# Patient Record
Sex: Male | Born: 1970 | Race: Black or African American | Hispanic: No | Marital: Married | Smoking: Current every day smoker
Health system: Southern US, Community
[De-identification: ages and names within clinical notes are randomized; demographics above are authoritative.]

## PROBLEM LIST (undated history)

## (undated) HISTORY — PX: APPENDECTOMY: SHX54

---

## 2019-12-15 ENCOUNTER — Encounter (HOSPITAL_BASED_OUTPATIENT_CLINIC_OR_DEPARTMENT_OTHER): Payer: Self-pay | Admitting: Emergency Medicine

## 2019-12-15 ENCOUNTER — Emergency Department (HOSPITAL_BASED_OUTPATIENT_CLINIC_OR_DEPARTMENT_OTHER): Payer: Medicaid Other

## 2019-12-15 ENCOUNTER — Emergency Department (HOSPITAL_BASED_OUTPATIENT_CLINIC_OR_DEPARTMENT_OTHER)
Admission: EM | Admit: 2019-12-15 | Discharge: 2019-12-15 | Disposition: A | Discharge status: Home / Self Care | Payer: Medicaid Other | Attending: Emergency Medicine | Admitting: Emergency Medicine

## 2019-12-15 ENCOUNTER — Other Ambulatory Visit: Payer: Self-pay

## 2019-12-15 DIAGNOSIS — R252 Cramp and spasm: Secondary | ICD-10-CM

## 2019-12-15 DIAGNOSIS — M62838 Other muscle spasm: Secondary | ICD-10-CM | POA: Insufficient documentation

## 2019-12-15 DIAGNOSIS — F17210 Nicotine dependence, cigarettes, uncomplicated: Secondary | ICD-10-CM | POA: Insufficient documentation

## 2019-12-15 DIAGNOSIS — M25511 Pain in right shoulder: Secondary | ICD-10-CM | POA: Diagnosis present

## 2019-12-15 MED ORDER — METHOCARBAMOL 500 MG PO TABS
500.0000 mg | ORAL_TABLET | Freq: Once | ORAL | Status: AC
  Administered 2019-12-15: 500 mg via ORAL
  Filled 2019-12-15: qty 1

## 2019-12-15 MED ORDER — LIDOCAINE 5 % EX PTCH
1.0000 | MEDICATED_PATCH | CUTANEOUS | Status: DC
  Administered 2019-12-15: 1 via TRANSDERMAL
  Filled 2019-12-15: qty 1

## 2019-12-15 MED ORDER — NAPROXEN 250 MG PO TABS
500.0000 mg | ORAL_TABLET | Freq: Once | ORAL | Status: AC
  Administered 2019-12-15: 500 mg via ORAL
  Filled 2019-12-15: qty 2

## 2019-12-15 MED ORDER — METHOCARBAMOL 500 MG PO TABS: 500.0000 mg | ORAL_TABLET | Freq: Two times a day (BID) | ORAL | 0 refills | Status: AC

## 2019-12-15 NOTE — ED Triage Notes (Signed)
Pt arrives pov with driver with c/o right shoulder pain x 1 week, denies injury. Pt reports feeling that he may have slept on it wrong.  Pt also verbalizes concern of reaction to icy hot. Small circular bumps noted. Pt denies itching

## 2019-12-15 NOTE — Discharge Instructions (Signed)
As we discussed, take the medications that are prescribed.  More importantly, stretch the back of your shoulder and your neck area by performing stretching exercises that we discussed.  You should be performing those exercises multiple times a day. Deep tissue massage, warm compresses should also be utilized.  Follow-up with the doctor as requested if your symptoms are not getting better, as you might need physical therapy in that case

## 2019-12-16 NOTE — ED Provider Notes (Signed)
[facility] EMERGENCY DEPARTMENT Provider Note   CSN: [id number] Arrival date & time: 12/15/19  1801     History Chief Complaint  Patient presents with  . Shoulder Pain    Keith Dyer is a 49 y.o. male.  HPI     49 year old male comes in a chief complaint of right-sided shoulder pain.  Patient reports that he started having shoulder pain about a week ago.  There is no injury.  He woke up on the pain in the shoulder.  There is no associated numbness chills.  Patient has no known injuries the right shoulder but used to lift heavy boxes few months back.  No past medical history on file.  There are no problems to display for this patient.      No family history on file.  Social History   Tobacco Use  . Smoking status: Current Every Day Smoker    Packs/day: 0.50    Types: Cigarettes  Substance Use Topics  . Alcohol use: Yes  . Drug use: Yes    Types: Marijuana    Home Medications Prior to Admission medications   Medication Sig Start Date End Date Taking? Authorizing Provider  methocarbamol (ROBAXIN) 500 MG tablet Take 1 tablet (500 mg total) by mouth 2 (two) times daily. 12/15/19   Derwood Kaplan, MD    Allergies    Patient has no allergy information on record.  Review of Systems   Review of Systems  Constitutional: Positive for activity change. Negative for fever.  Respiratory: Negative for shortness of breath.   Cardiovascular: Negative for chest pain.  Musculoskeletal: Positive for arthralgias.  Allergic/Immunologic: Negative for immunocompromised state.    Physical Exam Updated Vital Signs BP 127/89 (BP Location: Right Arm)   Pulse 72   Temp 99 F (37.2 C) (Oral)   Resp 19   Ht 5\' 9"  (1.753 m)   Wt 87.1 kg   SpO2 100%   BMI 28.35 kg/m   Physical Exam Vitals and nursing note reviewed.  Constitutional:      Appearance: He is well-developed.  HENT:     Head: Atraumatic.  Cardiovascular:     Rate and Rhythm: Normal rate.   Pulmonary:     Effort: Pulmonary effort is normal.  Musculoskeletal:     Cervical back: Neck supple.     Comments: Patient has reproducible tenderness with palpation of the scapular region, paraspinal region over the cervical spine.  He has large palpable spasms, and his symptoms are reproduced with manipulation of them.  No C-spine midline tenderness.    Skin:    General: Skin is warm.  Neurological:     Mental Status: He is alert and oriented to person, place, and time.     ED Results / Procedures / Treatments   Labs (all labs ordered are listed, but only abnormal results are displayed) Labs Reviewed - No data to display  EKG None  Radiology DG Shoulder Right  Result Date: 12/15/2019 CLINICAL DATA:  Right shoulder pain EXAM: RIGHT SHOULDER - 2+ VIEW COMPARISON:  None. FINDINGS: There is no acute displaced fracture. No dislocation. Incidentally noted is an os acromiale, a normal variant. There are minimal degenerative changes of the glenohumeral joint. IMPRESSION: No acute displaced fracture or dislocation. Minimal degenerative changes of the glenohumeral joint. Electronically Signed   By: 14/05/2019 M.D.   On: 12/15/2019 19:09    Procedures Procedures (including critical care time)  Medications Ordered in ED Medications  naproxen (NAPROSYN) tablet 500  mg (500 mg Oral Given 12/15/19 2245)  methocarbamol (ROBAXIN) tablet 500 mg (500 mg Oral Given 12/15/19 2245)    ED Course  I have reviewed the triage vital signs and the nursing notes.  Pertinent labs & imaging results that were available during my care of the patient were reviewed by me and considered in my medical decision making (see chart for details).    MDM Rules/Calculators/A&P                          Patient comes in a chief complaint of right-sided shoulder pain into 1 week.  He is immunocompetent.  On exam he is able to abduct and forward flex his shoulder without significant discomfort.  He is having  significant discomfort with manipulation of the large spasms he has.  I suspect that he has progressively gotten worse with his spasms, and that is impacting his pain.  We went over multiple stretching exercises and other conservative measures to relieve the spasms.  Follow-up to sports medicine provided if patient does not get better -as he will likely need granule injections or sickle therapy.  Final Clinical Impression(s) / ED Diagnoses Final diagnoses:  Spasm    Rx / DC Orders ED Discharge Orders         Ordered    methocarbamol (ROBAXIN) 500 MG tablet  2 times daily        12/15/19 2237           Derwood Kaplan, MD 12/16/19 1713

## 2022-08-13 IMAGING — CR DG SHOULDER 2+V*R*
3 series · 3 of 3 positions shown · non-contrast
Comparison: None.

CLINICAL DATA: Right shoulder pain

EXAM:
RIGHT SHOULDER - 2+ VIEW

[w shoulder grashey right]
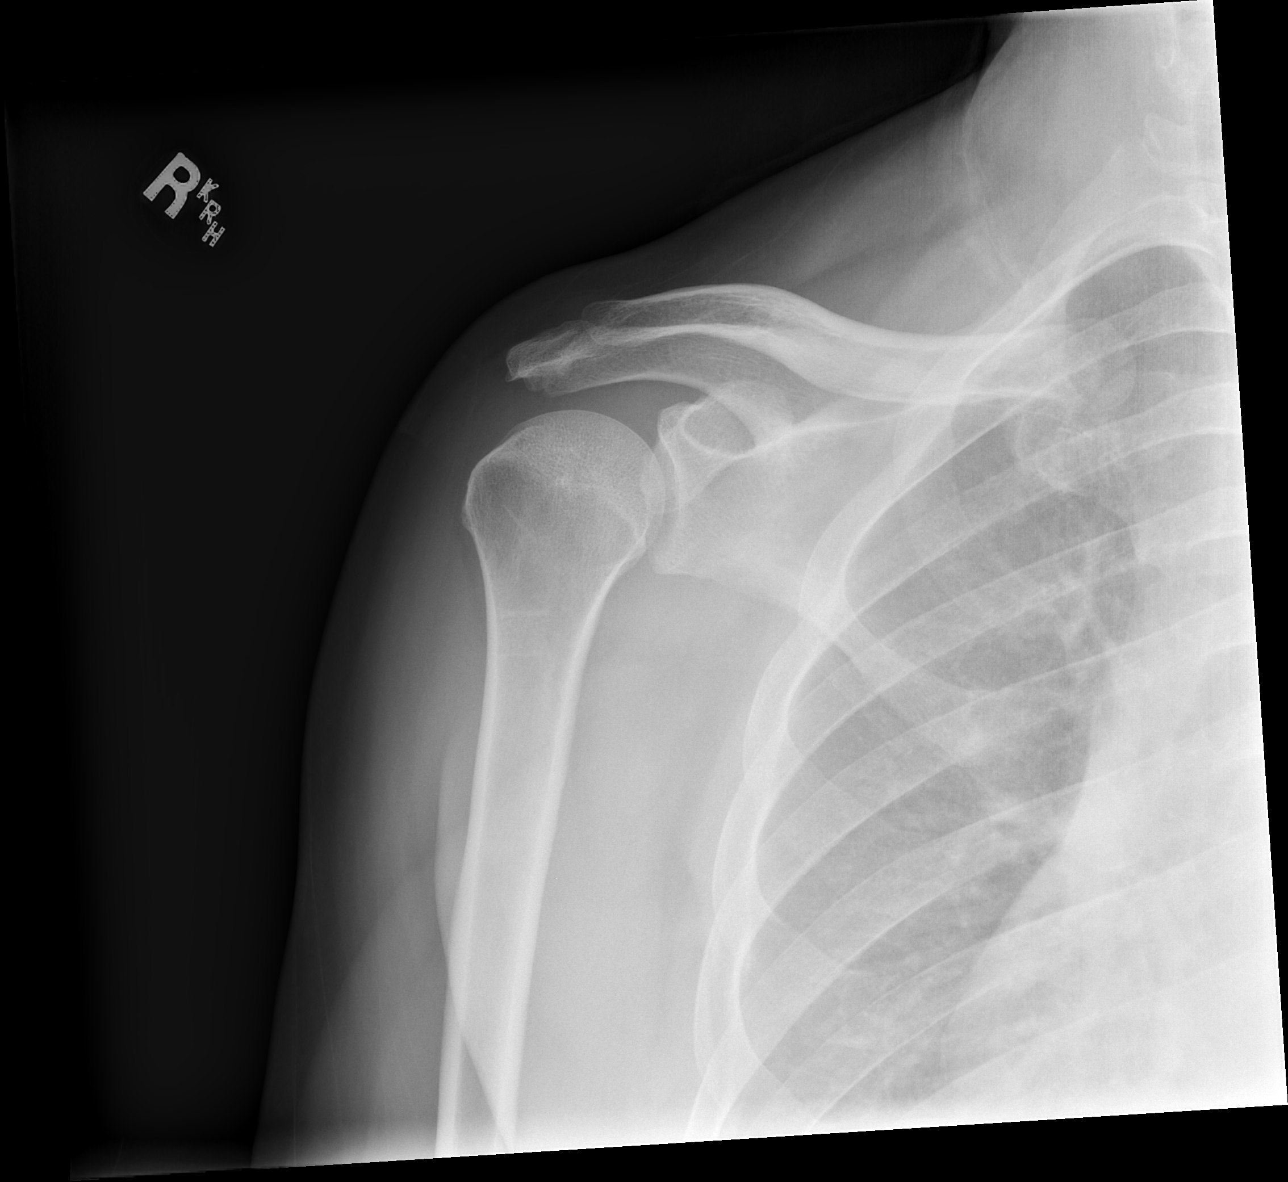

[w shoulder y view right]
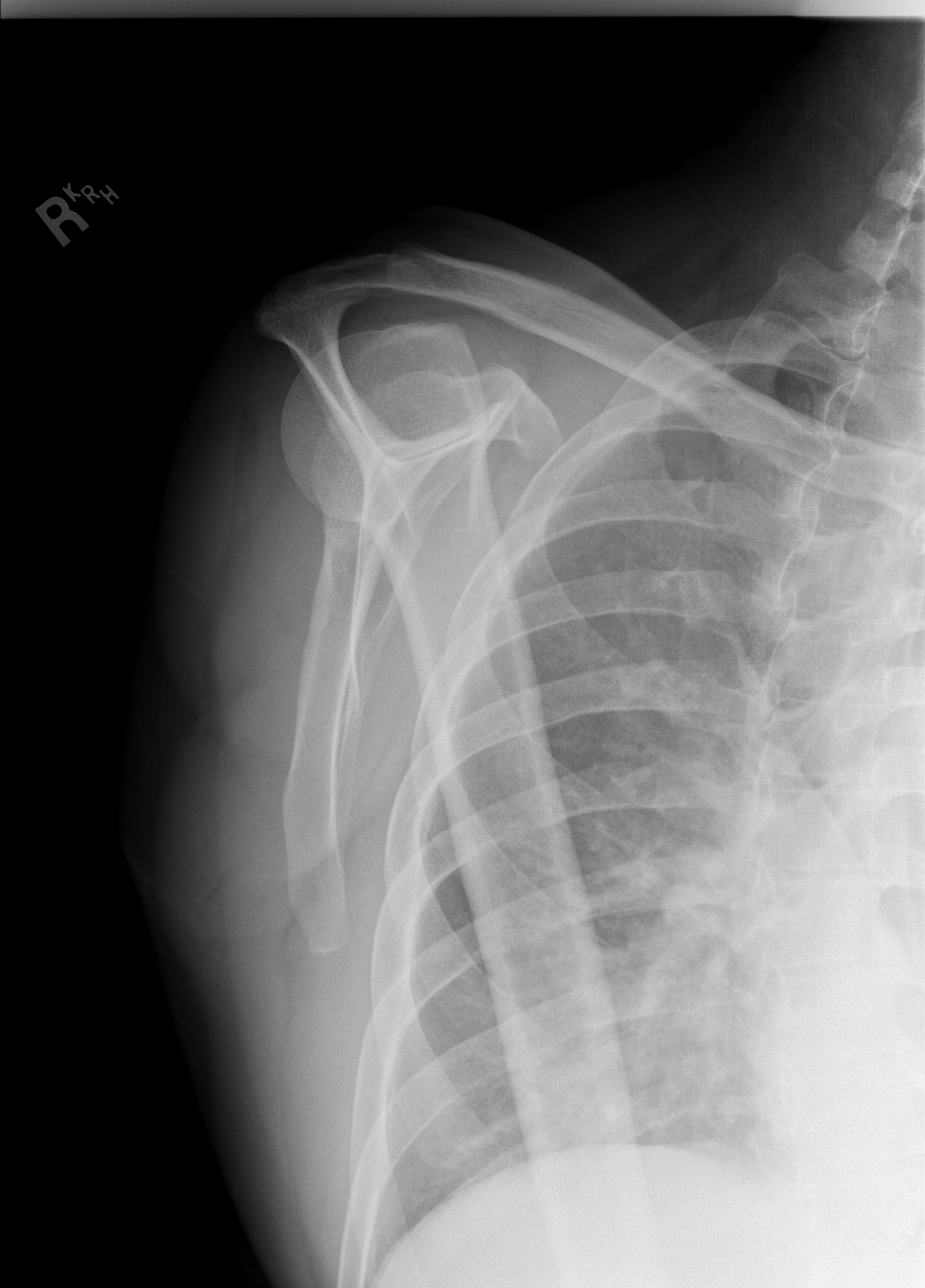

[x shoulder axillary right]
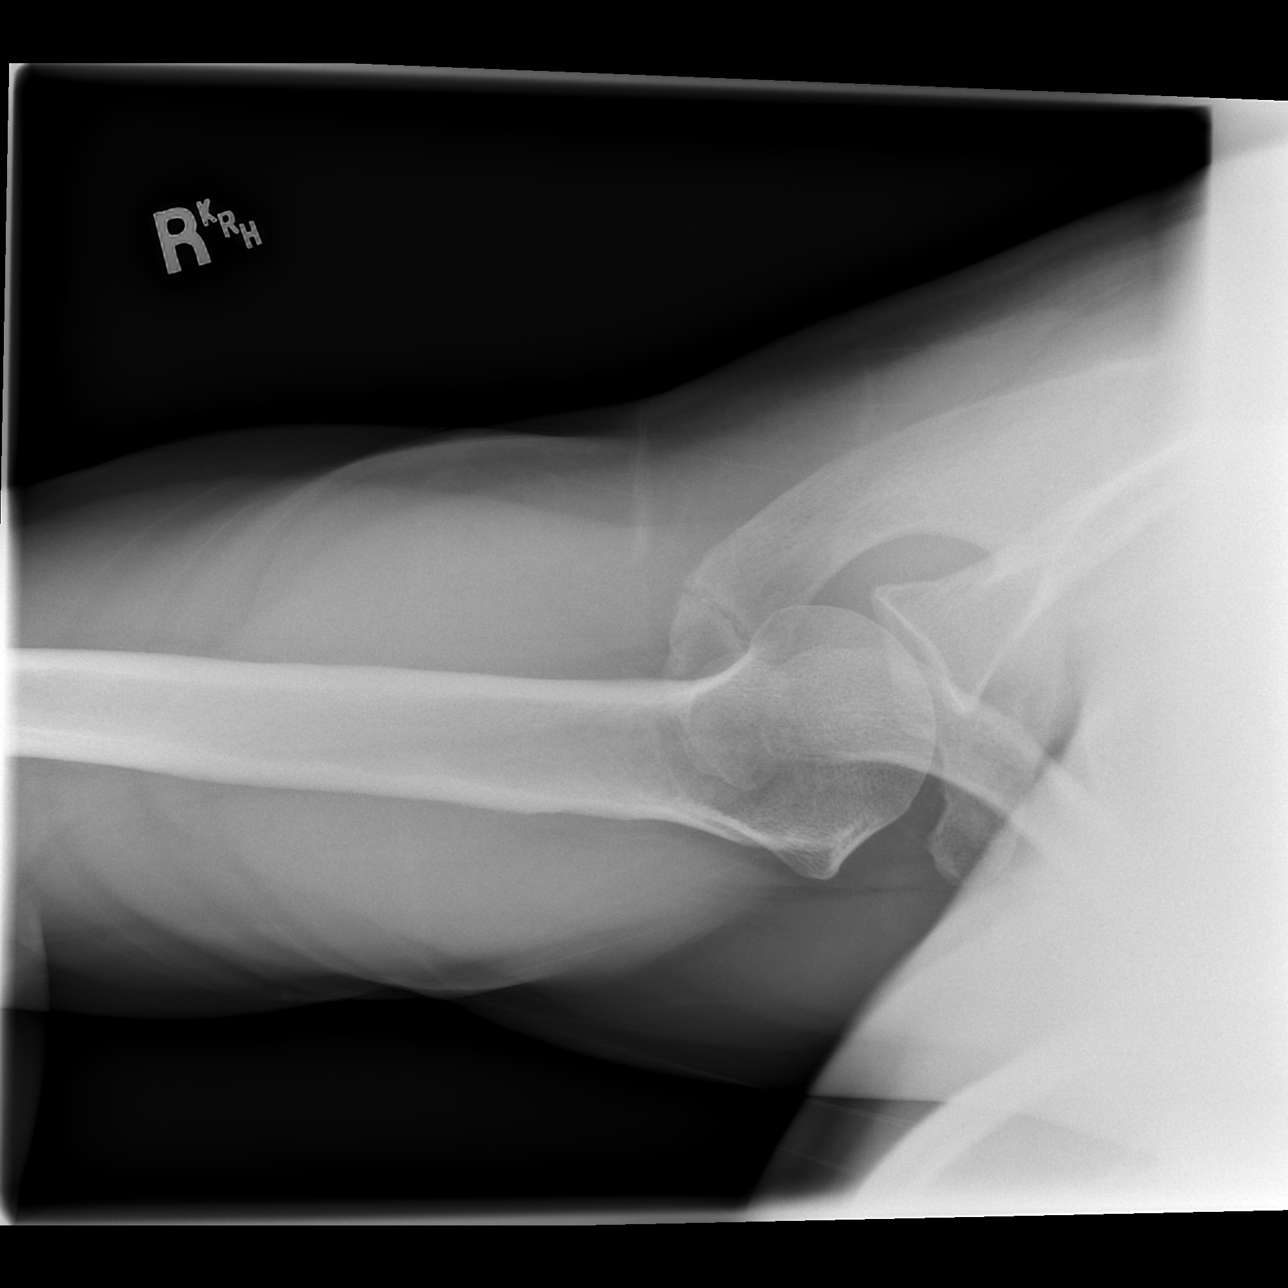

[3 of 3 positions shown; findings below may reference images not displayed]

FINDINGS: There is no acute displaced fracture. No dislocation. Incidentally
noted is an os acromiale, a normal variant. There are minimal
degenerative changes of the glenohumeral joint.
IMPRESSION: No acute displaced fracture or dislocation. Minimal degenerative
changes of the glenohumeral joint.
